# Patient Record
Sex: Female | Born: 1995 | Race: Black or African American | Hispanic: No | Marital: Single
Health system: Southern US, Community
[De-identification: ages and names within clinical notes are randomized; demographics above are authoritative.]

## PROBLEM LIST (undated history)

## (undated) DIAGNOSIS — J45909 Unspecified asthma, uncomplicated: Secondary | ICD-10-CM

## (undated) HISTORY — PX: OTHER SURGICAL HISTORY: SHX169

---

## 2015-11-12 ENCOUNTER — Encounter (HOSPITAL_COMMUNITY): Payer: Self-pay | Admitting: Family Medicine

## 2015-11-12 ENCOUNTER — Emergency Department (HOSPITAL_COMMUNITY): Payer: Self-pay

## 2015-11-12 ENCOUNTER — Emergency Department (HOSPITAL_COMMUNITY)
Admission: EM | Admit: 2015-11-12 | Discharge: 2015-11-12 | Disposition: A | Discharge status: Home / Self Care | Payer: Self-pay | Attending: Emergency Medicine | Admitting: Emergency Medicine

## 2015-11-12 DIAGNOSIS — J45901 Unspecified asthma with (acute) exacerbation: Secondary | ICD-10-CM | POA: Insufficient documentation

## 2015-11-12 HISTORY — DX: Unspecified asthma, uncomplicated: J45.909

## 2015-11-12 MED ORDER — PREDNISONE 20 MG PO TABS
60.0000 mg | ORAL_TABLET | Freq: Once | ORAL | Status: AC
  Administered 2015-11-12: 60 mg via ORAL
  Filled 2015-11-12: qty 3

## 2015-11-12 MED ORDER — ALBUTEROL SULFATE (2.5 MG/3ML) 0.083% IN NEBU
5.0000 mg | INHALATION_SOLUTION | Freq: Once | RESPIRATORY_TRACT | Status: AC
  Administered 2015-11-12: 5 mg via RESPIRATORY_TRACT

## 2015-11-12 MED ORDER — ALBUTEROL SULFATE (2.5 MG/3ML) 0.083% IN NEBU
INHALATION_SOLUTION | RESPIRATORY_TRACT | Status: AC
  Filled 2015-11-12: qty 3

## 2015-11-12 MED ORDER — ALBUTEROL SULFATE HFA 108 (90 BASE) MCG/ACT IN AERS
2.0000 | INHALATION_SPRAY | RESPIRATORY_TRACT | Status: DC | PRN
  Administered 2015-11-12: 2 via RESPIRATORY_TRACT
  Filled 2015-11-12: qty 6.7

## 2015-11-12 MED ORDER — IPRATROPIUM-ALBUTEROL 0.5-2.5 (3) MG/3ML IN SOLN
3.0000 mL | Freq: Once | RESPIRATORY_TRACT | Status: AC
  Administered 2015-11-12: 3 mL via RESPIRATORY_TRACT
  Filled 2015-11-12: qty 3

## 2015-11-12 MED ORDER — PREDNISONE 20 MG PO TABS: 40.0000 mg | ORAL_TABLET | Freq: Every day | ORAL | Status: AC

## 2015-11-12 NOTE — Discharge Instructions (Signed)
Asthma, Adult Asthma is a condition of the lungs in which the airways tighten and narrow. Asthma can make it hard to breathe. Asthma cannot be cured, but medicine and lifestyle changes can help control it. Asthma may be started (triggered) by:  Animal skin flakes (dander).  Dust.  Cockroaches.  Pollen.  Mold.  Smoke.  Cleaning products.  Hair sprays or aerosol sprays.  Paint fumes or strong smells.  Cold air, weather changes, and winds.  Crying or laughing hard.  Stress.  Certain medicines or drugs.  Foods, such as dried fruit, potato chips, and sparkling grape juice.  Infections or conditions (colds, flu).  Exercise.  Certain medical conditions or diseases.  Exercise or tiring activities. HOME CARE   Take medicine as told by your doctor.  Use a peak flow meter as told by your doctor. A peak flow meter is a tool that measures how well the lungs are working.  Record and keep track of the peak flow meter's readings.  Understand and use the asthma action plan. An asthma action plan is a written plan for taking care of your asthma and treating your attacks.  To help prevent asthma attacks:  Do not smoke. Stay away from secondhand smoke.  Change your heating and air conditioning filter often.  Limit your use of fireplaces and wood stoves.  Get rid of pests (such as roaches and mice) and their droppings.  Throw away plants if you see mold on them.  Clean your floors. Dust regularly. Use cleaning products that do not smell.  Have someone vacuum when you are not home. Use a vacuum cleaner with a HEPA filter if possible.  Replace carpet with wood, tile, or vinyl flooring. Carpet can trap animal skin flakes and dust.  Use allergy-proof pillows, mattress covers, and box spring covers.  Wash bed sheets and blankets every week in hot water and dry them in a dryer.  Use blankets that are made of polyester or cotton.  Clean bathrooms and kitchens with bleach.  If possible, have someone repaint the walls in these rooms with mold-resistant paint. Keep out of the rooms that are being cleaned and painted.  Wash hands often. GET HELP IF:  You have make a whistling sound when breaking (wheeze), have shortness of breath, or have a cough even if taking medicine to prevent attacks.  The colored mucus you cough up (sputum) is thicker than usual.  The colored mucus you cough up changes from clear or white to yellow, green, gray, or bloody.  You have problems from the medicine you are taking such as:  A rash.  Itching.  Swelling.  Trouble breathing.  You need reliever medicines more than 2-3 times a week.  Your peak flow measurement is still at 50-79% of your personal best after following the action plan for 1 hour.  You have a fever. GET HELP RIGHT AWAY IF:   You seem to be worse and are not responding to medicine during an asthma attack.  You are short of breath even at rest.  You get short of breath when doing very little activity.  You have trouble eating, drinking, or talking.  You have chest pain.  You have a fast heartbeat.  Your lips or fingernails start to turn blue.  You are light-headed, dizzy, or faint.  Your peak flow is less than 50% of your personal best.   This information is not intended to replace advice given to you by your health care provider. Make sure   you discuss any questions you have with your health care provider.   Document Released: 05/12/2008 Document Revised: 08/15/2015 Document Reviewed: 06/23/2013 Elsevier Interactive Patient Education 2016 [employer]  

## 2015-11-12 NOTE — ED Provider Notes (Signed)
CSN: [id number]     Arrival date & time 11/12/15  1136 History  By signing my name below, I, Jarvis Morgan, attest that this documentation has been prepared under the direction and in the presence of Teressa Lower, NP Electronically Signed: Jarvis Morgan, ED Scribe. 11/12/2015. 1:48 PM.    Chief Complaint  Patient presents with  . Cough    The history is provided by the patient. No language interpreter was used.    HPI Comments: Terry Yu is a 19 y.o. female with a h/o asthma who presents to the Emergency Department complaining of intermittent, moderate, cough productive of green sputum onset several days that began to gradually worsen last night. She reports associated intermittent SOB. Pt states she has been using her inhaler a lot with no significant relief. She notes that she is not a smoker but has passive smoke exposure through her home which tends to exacerbate her asthma symptoms. She does not have a nebulizer at home. Pt states she has not been any prednisone recently. She denies any other associated symptoms at this time.   Past Medical History  Diagnosis Date  . Asthma    History reviewed. No pertinent past surgical history. History reviewed. No pertinent family history. Social History  Substance Use Topics  . Smoking status: Passive Smoke Exposure - Never Smoker  . Smokeless tobacco: None  . Alcohol Use: No   OB History    No data available     Review of Systems  Respiratory: Positive for cough and shortness of breath.   All other systems reviewed and are negative.     Allergies  Review of patient's allergies indicates no known allergies.  Home Medications   Prior to Admission medications   Not on File   Triage Vitals: BP 109/95 mmHg  Pulse 122  Temp(Src) 98.9 F (37.2 C)  Resp 22  Ht  (1.778 m)  Wt 278 lb 3 oz (126.185 kg)  BMI 39.92 kg/m2  SpO2 97%  LMP 10/22/2015  Physical Exam  Constitutional: She is oriented to person,  place, and time. She appears well-developed and well-nourished. No distress.  HENT:  Head: Normocephalic and atraumatic.  Right Ear: Tympanic membrane normal.  Left Ear: Tympanic membrane normal.  Mouth/Throat: Uvula is midline. Posterior oropharyngeal erythema present.  Nasal congestion  Eyes: Conjunctivae and EOM are normal.  Neck: Neck supple. No tracheal deviation present.  Cardiovascular: Normal rate, regular rhythm and normal heart sounds.   Pulmonary/Chest: Effort normal. No respiratory distress. She has wheezes.  Musculoskeletal: Normal range of motion.  Neurological: She is alert and oriented to person, place, and time.  Skin: Skin is warm and dry.  Psychiatric: She has a normal mood and affect. Her behavior is normal.  Nursing note and vitals reviewed.   ED Course  Procedures (including critical care time)  DIAGNOSTIC STUDIES: Oxygen Saturation is 97% on RA, normal by my interpretation.    COORDINATION OF CARE:  1:23 PM-Will order breathing treatment and CXR.  Pt advised of plan for treatment and pt agrees.    Labs Review Labs Reviewed - No data to display  Imaging Review Dg Chest 2 View  11/12/2015  CLINICAL DATA:  Shortness of breath since last night. Asthma history. EXAM: CHEST  2 VIEW COMPARISON:  None. FINDINGS: Normal heart size and mediastinal contours. No acute infiltrate or edema. No effusion or pneumothorax. No acute osseous findings. IMPRESSION: Negative chest. Electronically Signed   By: Marnee Spring M.D.   On:  11/12/2015 13:23   I have personally reviewed and evaluated these images as part of my medical decision-making.   EKG Interpretation None      MDM   Final diagnoses:  Asthma exacerbation    Pt is feeling better after treatments. Will sent home with inhaler and prednisone. Pt in agreement with plan  I personally performed the services described in this documentation, which was scribed in my presence. The recorded information has been  reviewed and is accurate.    Teressa LowerVrinda Won Kreuzer, NP 11/12/15 1423  Alvira MondayErin Schlossman, MD 11/12/15 2216

## 2015-11-12 NOTE — ED Notes (Signed)
sts here for cough and SOB. sts some productive sputum. sts worse last night. sts using inhaler a lot.

## 2016-04-06 ENCOUNTER — Emergency Department (HOSPITAL_COMMUNITY)
Admission: EM | Admit: 2016-04-06 | Discharge: 2016-04-06 | Disposition: A | Discharge status: Home / Self Care | Payer: Medicaid Other | Attending: Emergency Medicine | Admitting: Emergency Medicine

## 2016-04-06 ENCOUNTER — Encounter (HOSPITAL_COMMUNITY): Payer: Self-pay | Admitting: Emergency Medicine

## 2016-04-06 DIAGNOSIS — S4991XA Unspecified injury of right shoulder and upper arm, initial encounter: Secondary | ICD-10-CM | POA: Insufficient documentation

## 2016-04-06 DIAGNOSIS — Y92410 Unspecified street and highway as the place of occurrence of the external cause: Secondary | ICD-10-CM | POA: Insufficient documentation

## 2016-04-06 DIAGNOSIS — Y998 Other external cause status: Secondary | ICD-10-CM | POA: Insufficient documentation

## 2016-04-06 DIAGNOSIS — S99912A Unspecified injury of left ankle, initial encounter: Secondary | ICD-10-CM | POA: Insufficient documentation

## 2016-04-06 DIAGNOSIS — Y9389 Activity, other specified: Secondary | ICD-10-CM | POA: Insufficient documentation

## 2016-04-06 DIAGNOSIS — S79911A Unspecified injury of right hip, initial encounter: Secondary | ICD-10-CM | POA: Insufficient documentation

## 2016-04-06 DIAGNOSIS — J45909 Unspecified asthma, uncomplicated: Secondary | ICD-10-CM | POA: Insufficient documentation
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
## 2016-04-14 IMAGING — DX DG CHEST 2V
2 series · 2 of 2 positions shown · non-contrast
Comparison: None.

CLINICAL DATA: Shortness of breath since last night. Asthma
history.

EXAM:
CHEST  2 VIEW

[w chest pa]
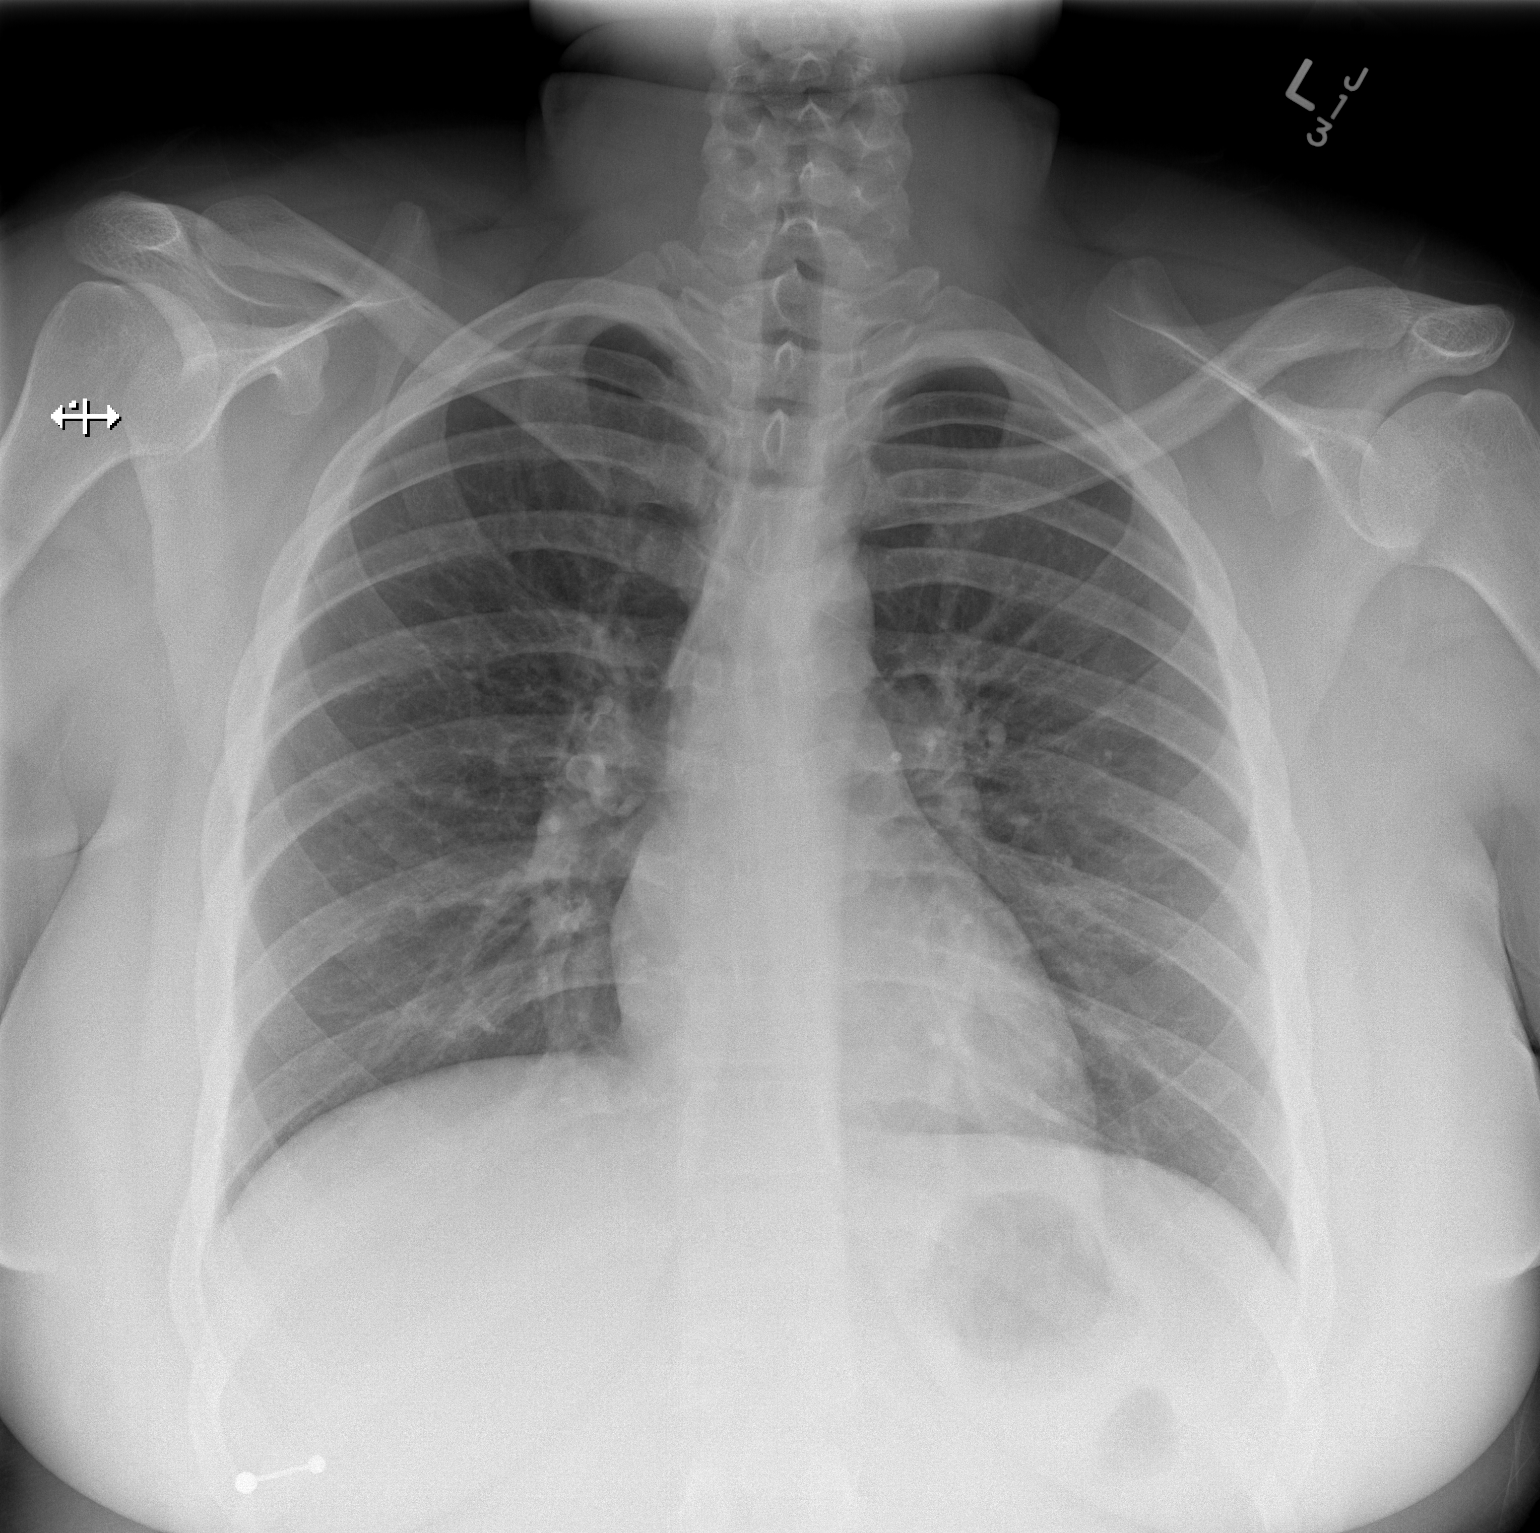

[w chest lat]
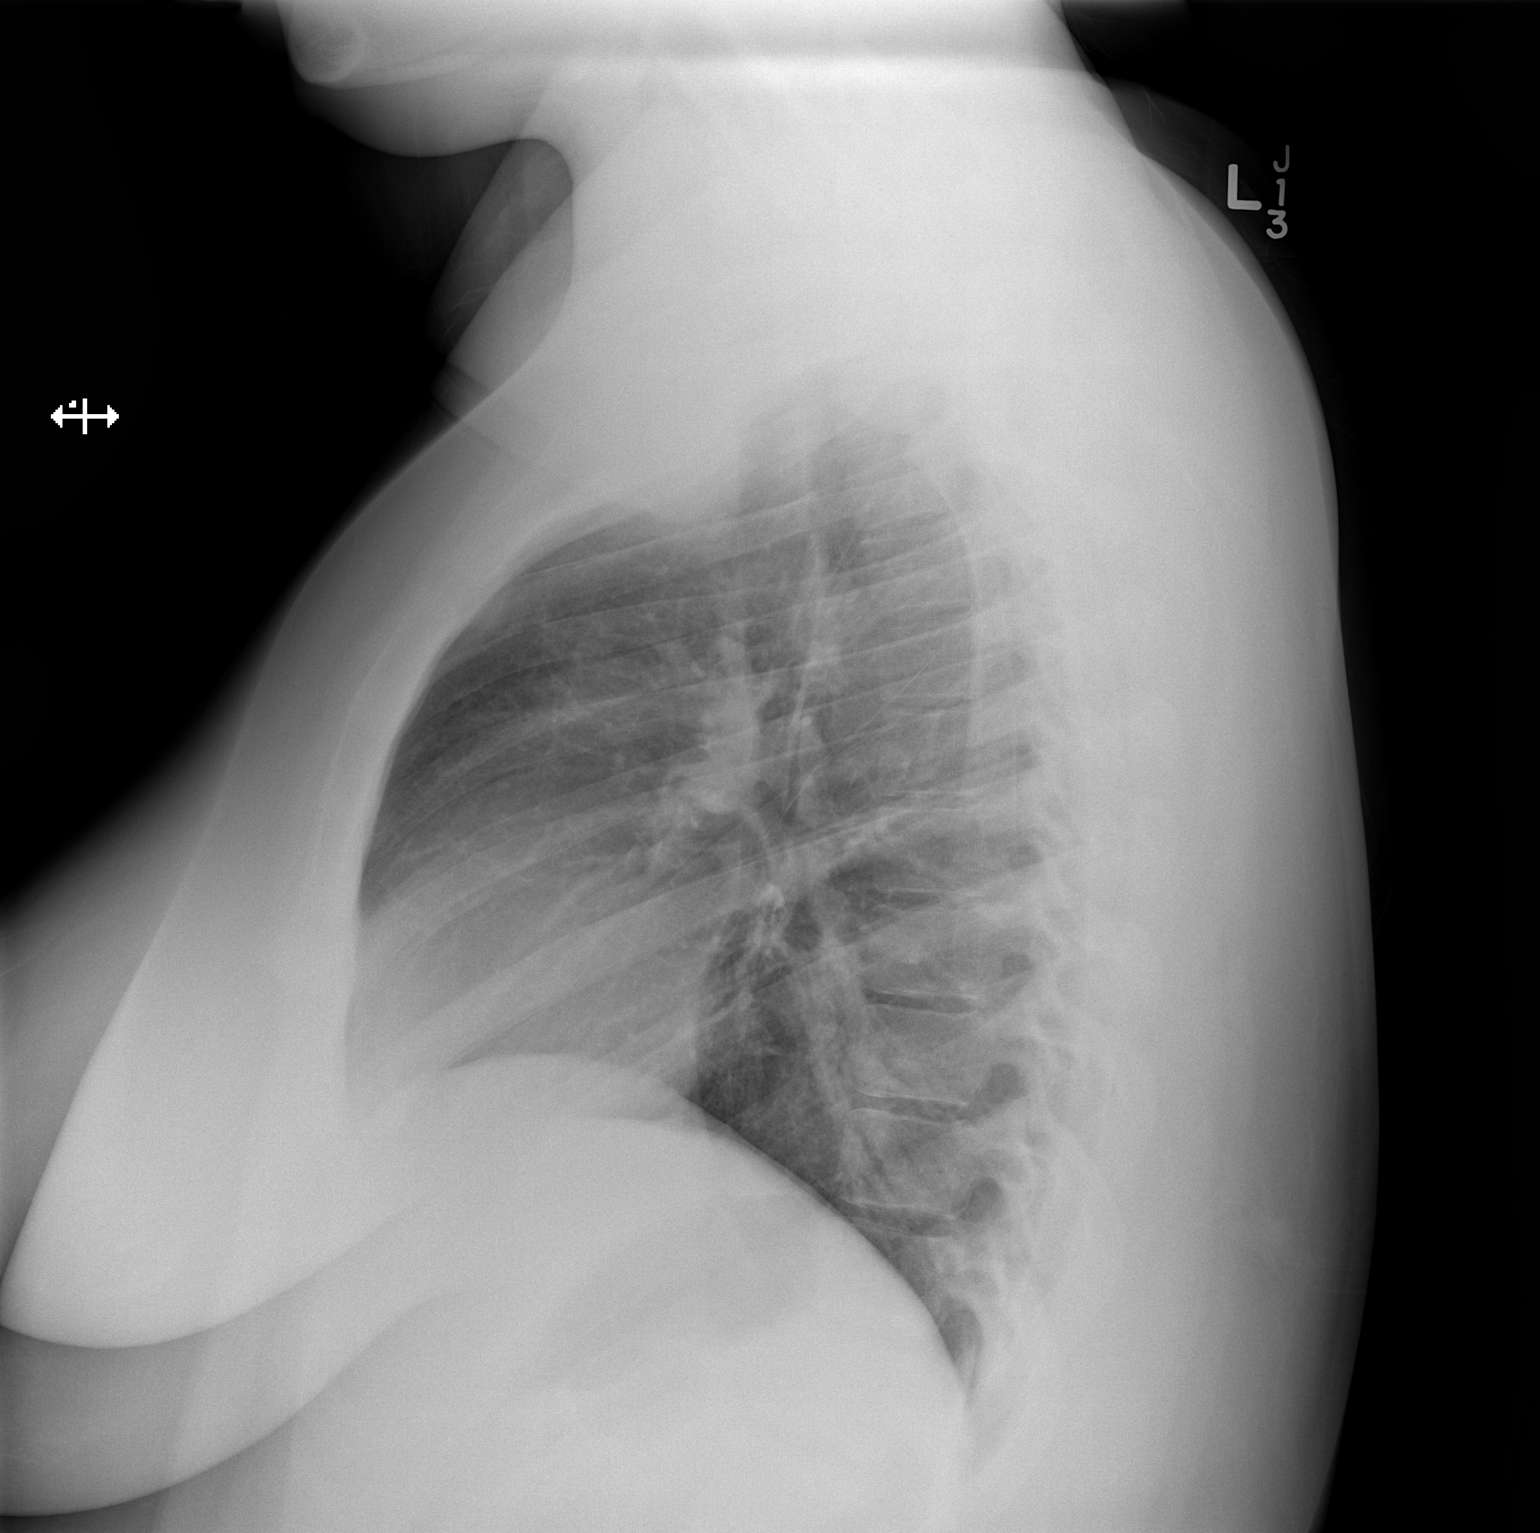

[2 of 2 positions shown; findings below may reference images not displayed]

FINDINGS: Normal heart size and mediastinal contours. No acute infiltrate or
edema. No effusion or pneumothorax. No acute osseous findings.
IMPRESSION: Negative chest.
